# Patient Record
Sex: Female | Born: 1970 | Race: White | Hispanic: No | Marital: Married
Health system: Southern US, Community
[De-identification: ages and names within clinical notes are randomized; demographics above are authoritative.]

---

## 1997-12-01 ENCOUNTER — Other Ambulatory Visit: Admission: RE | Admit: 1997-12-01 | Discharge: 1997-12-01 | Payer: Self-pay | Admitting: Obstetrics and Gynecology

## 1998-05-27 ENCOUNTER — Other Ambulatory Visit: Admission: RE | Admit: 1998-05-27 | Discharge: 1998-05-27 | Payer: Self-pay | Admitting: Obstetrics and Gynecology

## 1999-02-22 ENCOUNTER — Other Ambulatory Visit: Admission: RE | Admit: 1999-02-22 | Discharge: 1999-02-22 | Payer: Self-pay | Admitting: Obstetrics and Gynecology

## 2001-04-02 ENCOUNTER — Encounter: Admission: RE | Admit: 2001-04-02 | Discharge: 2001-04-02 | Payer: Self-pay | Admitting: Obstetrics and Gynecology

## 2001-04-02 ENCOUNTER — Encounter: Payer: Self-pay | Admitting: Obstetrics and Gynecology

## 2003-05-26 ENCOUNTER — Other Ambulatory Visit: Admission: RE | Admit: 2003-05-26 | Discharge: 2003-05-26 | Payer: Self-pay | Admitting: Obstetrics and Gynecology

## 2003-05-29 ENCOUNTER — Emergency Department (HOSPITAL_COMMUNITY): Admission: EM | Admit: 2003-05-29 | Discharge: 2003-05-29 | Payer: Self-pay | Admitting: Emergency Medicine

## 2004-09-15 ENCOUNTER — Other Ambulatory Visit: Admission: RE | Admit: 2004-09-15 | Discharge: 2004-09-15 | Payer: Self-pay | Admitting: Obstetrics and Gynecology

## 2005-08-07 ENCOUNTER — Inpatient Hospital Stay (HOSPITAL_COMMUNITY): Admission: AD | Admit: 2005-08-07 | Discharge: 2005-08-08 | Payer: Self-pay | Admitting: Obstetrics and Gynecology

## 2005-12-09 ENCOUNTER — Inpatient Hospital Stay (HOSPITAL_COMMUNITY): Admission: AD | Admit: 2005-12-09 | Discharge: 2005-12-12 | Payer: Self-pay | Admitting: Obstetrics and Gynecology

## 2006-01-29 ENCOUNTER — Other Ambulatory Visit: Admission: RE | Admit: 2006-01-29 | Discharge: 2006-01-29 | Payer: Self-pay | Admitting: Obstetrics and Gynecology

## 2009-07-07 ENCOUNTER — Inpatient Hospital Stay (HOSPITAL_COMMUNITY)
Admission: AD | Admit: 2009-07-07 | Discharge: 2009-07-07 | Payer: Self-pay | Source: Home / Self Care | Admitting: Obstetrics and Gynecology

## 2009-08-09 ENCOUNTER — Inpatient Hospital Stay (HOSPITAL_COMMUNITY): Admission: AD | Admit: 2009-08-09 | Discharge: 2009-08-12 | Payer: Self-pay | Admitting: Obstetrics and Gynecology

## 2009-08-09 ENCOUNTER — Inpatient Hospital Stay (HOSPITAL_COMMUNITY): Admission: AD | Admit: 2009-08-09 | Discharge: 2009-08-09 | Payer: Self-pay | Admitting: Obstetrics and Gynecology

## 2010-06-10 ENCOUNTER — Encounter: Payer: Self-pay | Admitting: Family Medicine

## 2010-08-09 LAB — COMPREHENSIVE METABOLIC PANEL
ALT: 42 U/L — ABNORMAL HIGH (ref 0–35)
AST: 52 U/L — ABNORMAL HIGH (ref 0–37)
Albumin: 2.6 g/dL — ABNORMAL LOW (ref 3.5–5.2)
Alkaline Phosphatase: 121 U/L — ABNORMAL HIGH (ref 39–117)
CO2: 23 mEq/L (ref 19–32)
Chloride: 103 mEq/L (ref 96–112)
Creatinine, Ser: 0.57 mg/dL (ref 0.4–1.2)
GFR calc Af Amer: >60 mL/min (ref >60)
GFR calc non Af Amer: >60 mL/min (ref >60)
Potassium: 3.9 mEq/L (ref 3.5–5.1)
Total Bilirubin: 0.9 mg/dL (ref 0.3–1.2)

## 2010-08-09 LAB — URINE MICROSCOPIC-ADD ON: RBC / HPF: NONE SEEN RBC/hpf (ref <3)

## 2010-08-09 LAB — URINALYSIS, ROUTINE W REFLEX MICROSCOPIC
Glucose, UA: 250 mg/dL — AB
Hgb urine dipstick: NEGATIVE
Ketones, ur: NEGATIVE mg/dL
Nitrite: NEGATIVE
Protein, ur: NEGATIVE mg/dL
Specific Gravity, Urine: 1.025 (ref 1.005–1.030)
Urobilinogen, UA: 1 mg/dL (ref 0.0–1.0)
pH: 5.5 (ref 5.0–8.0)

## 2010-08-09 LAB — CBC
MCV: 82.7 fL (ref 78.0–100.0)
Platelets: 300 10*3/uL (ref 150–400)
RBC: 3.62 MIL/uL — ABNORMAL LOW (ref 3.87–5.11)
WBC: 12.2 10*3/uL — ABNORMAL HIGH (ref 4.0–10.5)

## 2010-08-09 LAB — AMYLASE: Amylase: 72 U/L (ref 0–105)

## 2010-08-13 LAB — CBC
HCT: 32.8 % — ABNORMAL LOW (ref 36.0–46.0)
Hemoglobin: 10.6 g/dL — ABNORMAL LOW (ref 12.0–15.0)
MCHC: 32.4 g/dL (ref 30.0–36.0)
MCHC: 33 g/dL (ref 30.0–36.0)
MCV: 80.9 fL (ref 78.0–100.0)
MCV: 81.4 fL (ref 78.0–100.0)
Platelets: 213 10*3/uL (ref 150–400)
Platelets: 285 10*3/uL (ref 150–400)
RBC: 3.19 MIL/uL — ABNORMAL LOW (ref 3.87–5.11)
RDW: 16.5 % — ABNORMAL HIGH (ref 11.5–15.5)
RDW: 16.7 % — ABNORMAL HIGH (ref 11.5–15.5)

## 2010-08-13 LAB — HEMOGLOBIN AND HEMATOCRIT, BLOOD
HCT: 26.3 % — ABNORMAL LOW (ref 36.0–46.0)
Hemoglobin: 8.8 g/dL — ABNORMAL LOW (ref 12.0–15.0)

## 2010-10-06 NOTE — H&P (Signed)
NAMESHAWNTEL, FARNWORTH             ACCOUNT NO.:  [id number]   MEDICAL RECORD NO.:  [id number]          PATIENT TYPE:  INP   LOCATION:  9164                          FACILITY:  [facility]   PHYSICIAN:  Osborn Coho, M.D.   DATE OF BIRTH:  [birth year]   DATE OF ADMISSION:  12/09/2005  DATE OF DISCHARGE:                                HISTORY & PHYSICAL   Ms. Cavallaro is a 40 year old, gravida 2, para 0-0-1-0, at 40-1/7 weeks, EDD  December 08, 2005, who presents following spontaneous rupture of membranes at  home for clear fluid  this a.m. She complains of mild irregular  contractions, positive fetal movement.  No vaginal bleeding.  Denies any  headache, visual changes or epigastric pain.  Her pregnancy has been  followed by the C.N.M. service at [facility] and is  remarkable for  1.  Anxiety.  2.  First trimester spotting  3.  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  4.  Group B strep negative.   This patient presented for prenatal care at the office of [facility] on April 20, 2005, at approximately [redacted] weeks gestation, EDD  determined by 7-week 2-day ultrasound and confirmed with followup. Her  pregnancy has been essentially unremarkable.  She has been size equal to  dates throughout, normotensive with no proteinuria. Prenatal lab work on  April 20, 2005:  Hemoglobin and hematocrit 12.1 and 35.3, respectively,  platelets 310,000.  Blood type and Rh O+, antibody screen negative, VDRL  nonreactive, rubella immune, hepatitis B surface antigen negative, HIV  negative.  Pap smear within normal limits.  GC and chlamydia negative.  CF  testing positive, husband CF negative.  At 28 weeks, 1-hour glucose  challenge 150.  RPR nonreactive.  Three-hour GTT with 1 abnormal value.  At  36 weeks, culture of the vaginal tract is negative for group B strep. The  patient has an allergy to SULFA which causes nausea and vomiting.  She  denies the use of tobacco, alcohol or  illicit drugs.   OB HISTORY:  In 1985, the patient had a first trimester spontaneous AB and a  D&C with no complications.  This is her second and current pregnancy.   PAST MEDICAL HISTORY:  Is unremarkable.   FAMILY HISTORY:  The patient's father with a history of chronic hypertension  and paternal aunts with thrombophlebitis, paternal grandmother dementia,  maternal grandmother pancreatic cancer.   GENETIC HISTORY:  There is no history of any familial or chromosomal  disorders, any children that were born with birth defects or any that died  in infancy.   SOCIAL HISTORY:  Ms. Wiegman is a married Caucasian female. She works as an  International aid/development worker. Her husband, Baylen Dea, is involved and supportive.  He works in home improvement. They are non-denominational in their Christian  faith.   REVIEW OF SYSTEMS:  Is as described above.  The patient is typical of one  with uterine pregnancy at term with premature rupture of membranes.   PHYSICAL EXAMINATION:  VITAL SIGNS: Stable.  The patient is afebrile.  HEENT: Is unremarkable.  HEART:  Regular rate and rhythm.  LUNGS: Are clear.  ABDOMEN: Is soft and nontender.  It is gravid in its contour. Uterine fundus  is noted to extend 38 cm above the level of the pubic symphysis.  Leopold's  maneuver finds the infant to be in a longitudinal lie, cephalic  presentation, and the estimated fetal weight is 7-1/2 pounds. The baseline  of the fetal heart rate monitor is 140s with average long-term variability.  Reactivity is present with no periodic changes.  EXTREMITIES: Show no pathologic edema.  DTRs are 1+ with no clonus.   ASSESSMENT:  1.  Intrauterine pregnancy at term.  2.  Premature rupture of membranes.  3.  Early labor.   PLAN:  Admit per Dr. Osborn Coho, routine C.N.M. orders.  Anticipate  spontaneous vaginal delivery.      Rica Koyanagi, C.N.M.      Osborn Coho, M.D.  Electronically Signed    SDM/MEDQ  D:   12/09/2005  T:  12/09/2005  Job:  [id number]

## 2010-10-06 NOTE — Discharge Summary (Signed)
NAMEALIYANAH, ROZAS             ACCOUNT NO.:  [id number]   MEDICAL RECORD NO.:  [id number]          PATIENT TYPE:  INP   LOCATION:  9115                          FACILITY:  [facility]   PHYSICIAN:  Janine Limbo, M.D.DATE OF BIRTH:  [birth year]   DATE OF ADMISSION:  12/09/2005  DATE OF DISCHARGE:                                 DISCHARGE SUMMARY   ADMISSION DIAGNOSES:  1.  Intrauterine pregnancy at term.  2.  Premature rupture of membranes at term.  3.  Early labor.   DISCHARGE DIAGNOSES:  1.  Term pregnancy.  2.  Second stage bradycardia.  3.  Third degree laceration.  4.  Anemia.   PROCEDURES:  1.  Vacuum extraction, vaginal delivery.  2.  Repair partial third degree laceration.   HOSPITAL COURSE:  Ms. Melcher is a 40 year old gravida 2, para 0-0-1-0 at 54  and 1/7 weeks, who was admitted on December 09, 2005 in the morning following  spontaneous rupture of membranes at approximately 8 a.m.  Pregnancy had been  remarkable for (1) anxiety, (2) first trimester spotting, (3) positive  cystic fibrosis carrier but husband cystic fibrosis carrier negative, (4)  Group B strep negative.   On exam, the patient was having some irregular contractions.  Her cervix was  1, 50% vertex at a -2 station with clear fluid noted.  The patient requested  to rest for a time.  Contractions remained mild and irregular.  Pitocin was  begun later that early evening.  She was given some IV pain medication later  that evening.  Epidural was placed prior to midnight.  Following the  epidural, the patient did have three episodes of bradycardia which seemed to  be related to hypotension.  Fetal scalp lead and pressure monitor were  inserted after rupture of a forebag with clear fluid noted during exam.  Cervix at this time was 3, 80% vertex and -2.  Pitocin was continued through  the night.  By approximately 6:30 a.m. contractions had begun to be much  more effective.  Cervix at that time was 7 cm  completely effaced with the  vertex at a 0 station.  The patient then progressed well to completely  dilated at 8:30 in the morning with a vertex at +1 station.  Temperature was  99.3.  There was no descent with initial pushing effort and there was  minimal sensation.  Therefore, the decision was made to allow the patient to  labor down for a time.  The patient began to push at approximately 9.  Subsequent to that she began to have some deep variable decelerations.  Dr.  Stefano Gaul was consulted.  Vacuum extraction was offered to the patient since  the vertex was at a +2 station.  The patient and her husband did consent.  The vacuum was applied by Dr. Stefano Gaul without difficulty and vaginal  delivery was accomplished. There was a third degree laceration noted.  Findings were a viable female by the name of Carley Hammed, weight 7 pounds,  Apgars  were 7 and 9.  Cord pH was 7.13.  Infant was taken to the full  term nursery  and mother was taken to recovery in good condition.  By postpartum day 1 the  patient was doing well.  She was up ad lib without any difficulty or  syncope.  Hemoglobin was 8.5 down from 11.3.  White blood cell count was  13.6.  Her physical exam was within normal limits.  She was breastfeeding.  By postpartum day 2 the patient was continuing to do well.  She was  tolerating a regular diet.  She was having good control of pain with p.o.  pain medications and other comfort measures.  Breastfeeding was going well.  She was deemed to receive full benefit of her hospital stay and was  discharged home.   DISCHARGE INSTRUCTIONS:  Per [employer].   DISCHARGE MEDICATIONS:  1.  Motrin 600 mg by mouth every six hours as needed for pain.  2.  Tylox 1 to 2 by mouth every three to four hours as needed for pain.  3.  Prenatal vitamin 1 by mouth daily.   Discharge follow-up will occur in six weeks at [facility] or p.r.n.      Renaldo Reel Emilee Hero, C.N.M.       Janine Limbo, M.D.  Electronically Signed    VLL/MEDQ  D:  12/12/2005  T:  12/12/2005  Job:  [id number]

## 2010-10-06 NOTE — Op Note (Signed)
[name], [name]             ACCOUNT NO.:  [id number]   MEDICAL RECORD NO.:  [id number]          PATIENT TYPE:  INP   LOCATION:  9164                          FACILITY:  [facility]   PHYSICIAN:  Janine Limbo, M.D.DATE OF BIRTH:  Oct 18, 1970   DATE OF PROCEDURE:  12/10/2005  DATE OF DISCHARGE:                                 OPERATIVE REPORT   PREOPERATIVE DIAGNOSES:  1.  Term intrauterine pregnancy (EDC is December 08, 2005).  2.  Second stage bradycardia.   POSTOPERATIVE DIAGNOSES:  1.  Term intrauterine pregnancy (EDC is December 08, 2005).  2.  Second stage bradycardia.  3.  Partial third degree midline laceration.   PROCEDURES:  1.  Vacuum extraction vaginal delivery.  2.  Repair of partial third degree laceration.   OBSTETRICIAN:  Dr. Leonard Schwartz.   NURSE MIDWIFE:  Nigel Bridgeman.   ANESTHETIC:  Epidural, 1% Xylocaine.   DISPOSITION:  Carrie Castaneda is a 40 year old female, gravida 2, para 0-0-1-0,  who presented on December 09, 2005.  She had rupture of membranes at  approximately 7 o'clock a.m. on December 09, 2005.  The patient has been  followed at the [facility]  [facility] for [facility].  This pregnancy has been largely  uncomplicated.  The patient labored slowly.  Pitocin augmentation was begun.  The patient was started on penicillin because she did not deliver within 18  hours of ruptured membranes.  Labor was thought to began at approximately 9  o'clock p.m. on December 09, 2005.  The patient was completely dilated at 8:19  a.m. on  December 10, 2005.  The patient pushed effectively.  The infant began  having decelerations that occurred after the peak of the contraction.  The  patient was able to push the fetal head to a +3 station.  After it became  apparent that the decelerations would be recurrent she was given oxygen.  The decelerations continued.  At this point, we discussed our options for  management,  which included  continued pushing, operative vaginal delivery,  and cesarean delivery.  The risk and benefits of those options were  discussed.  The patient and her husband elected to proceed with operative  vaginal delivery.  We discussed forceps vaginal delivery and vacuum  extraction vaginal delivery.  The patient and her husband wanted to proceed  with vacuum extraction vaginal delivery.  The specific risks associated with  vacuum extraction vaginal delivery were reviewed including caput formation,  hematoma formation, the rare risk of intracranial bleeding, and the risk  that the vacuum extraction would be unsuccessful and that we would still  need to proceed with cesarean delivery.  The patient and her husband  understand all the above risks and were ready to proceed.   FINDINGS:  The weight of the infant is currently not known.  A female infant  was delivered Carley Hammed).  The Apgar scores were 7 at 1 minute and 9 at 5  minutes.  There was a three-vessel cord present.  Placenta was normal.  The  perineum was inspected and the patient was  noted to have a partial third  degree laceration.  The arterial cord blood pH was 7.13.   PROCEDURE:  The patient was placed in a lithotomy position in the delivery  suite.  The perineum was prepped for delivery.  A sterile drape was used.  The Foley catheter that was in the bladder was removed.  The intrauterine  pressure catheter and the fetal scalp electrode were removed.  The cervix  was noted to be completely dilated and 100% effaced.  The infant was thought  to be of medium size.  The fetal head was in an occiput anterior position.  The Kiwi vacuum extractor was applied.  The patient was allowed to push.  One pop off occurred.  The Kiwi was reapplied and the patient was then  able to deliver the fetal head.  The mouth and nose were suctioned.  The  remainder of the infant was then delivered.  The cord was clamped and cut  and the infant was handed to Dr. Rennis Golden of the pediatric team.  Routine cord blood studies were obtained.  The perineum was inspected.  A  partial third degree laceration was noted.  3-0 Vicryl was placed in the  capsule of the rectum and the edges were reapproximated.  We then used a  running 3-0 Vicryl suture in a standard fashion to close the remainder of  the laceration.  The placenta was then removed.  The patient's fundus was  massaged and hemostasis was noted to be adequate.  The patient was returned  to the supine position.  The baby was allowed to remain in the room with the  mother and father for bonding.      Janine Limbo, M.D.  Electronically Signed     AVS/MEDQ  D:  12/10/2005  T:  12/10/2005  Job:  [id number]

## 2011-07-22 IMAGING — US US ABDOMEN COMPLETE
1 series · 13 of 25 positions shown · non-contrast
Comparison: None available.

CLINICAL DATA: Right upper quadrant pain.  35 weeks pregnant.

COMPLETE ABDOMINAL ULTRASOUND

[Series 1: us abdomen complete · 0.29mm/px · 13 of 72 slices shown]
[im 1/72]
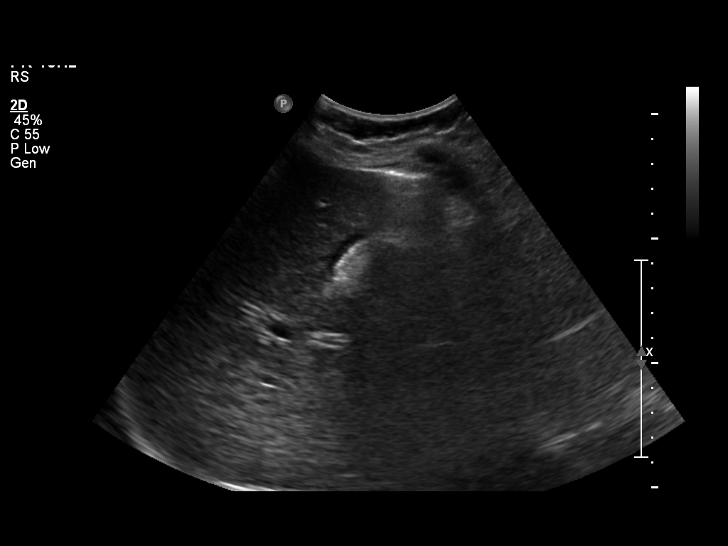
[im 6/72]
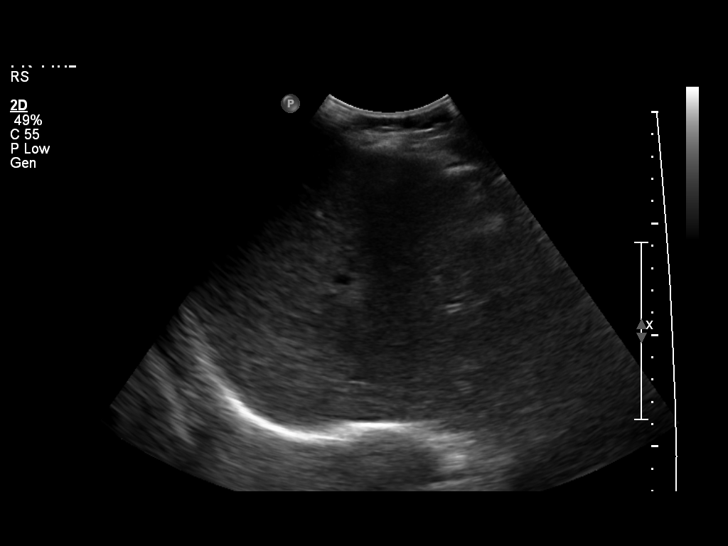
[im 12/72]
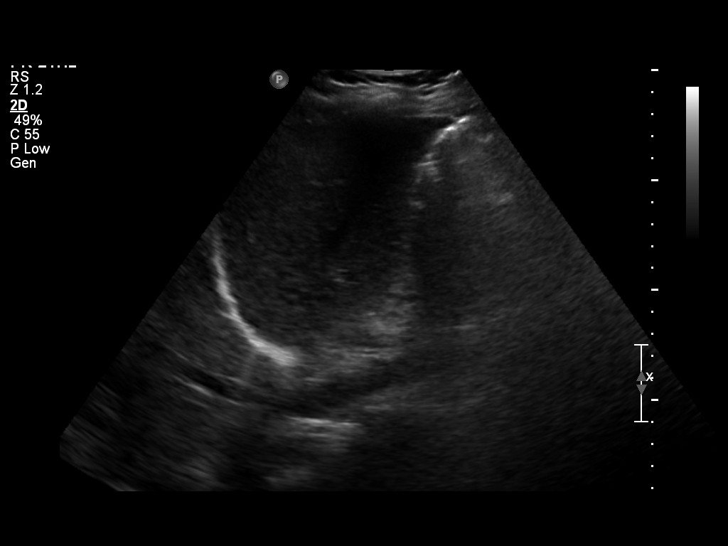
[im 18/72]
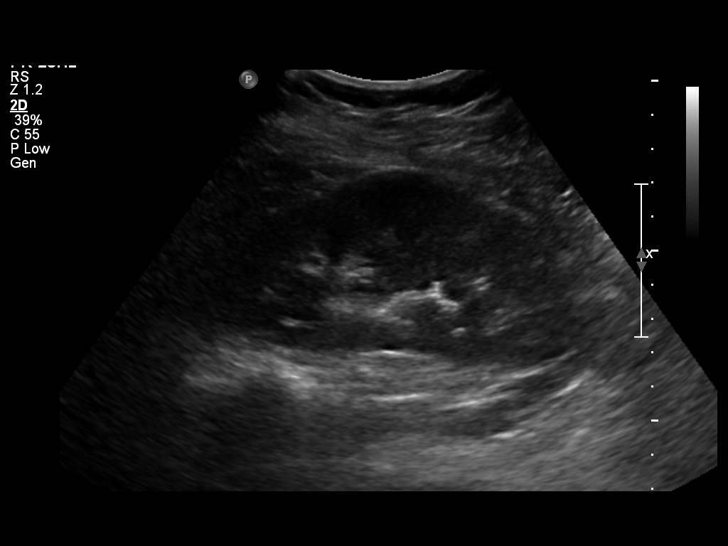
[im 24/72]
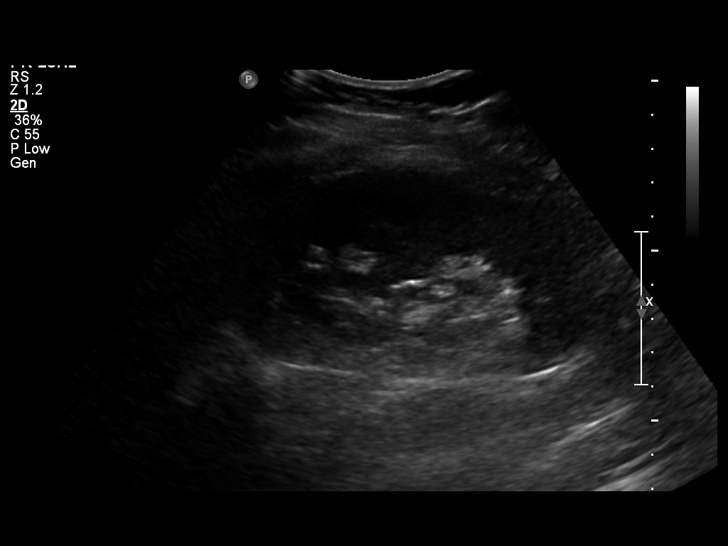
[im 30/72]
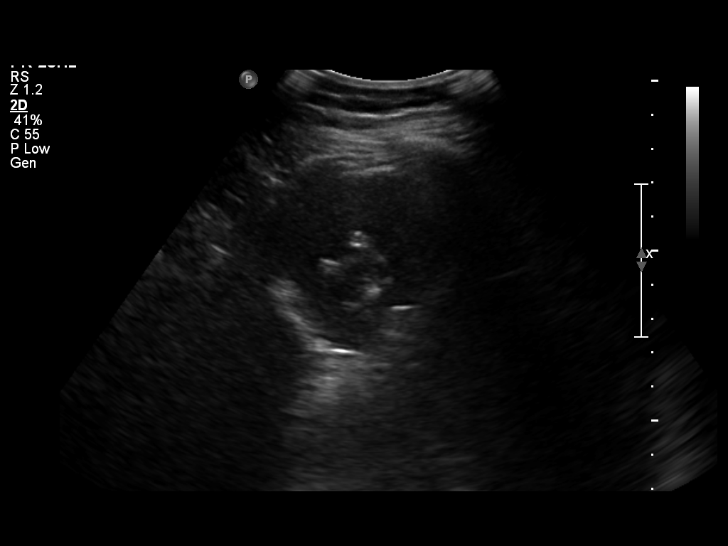
[im 36/72]
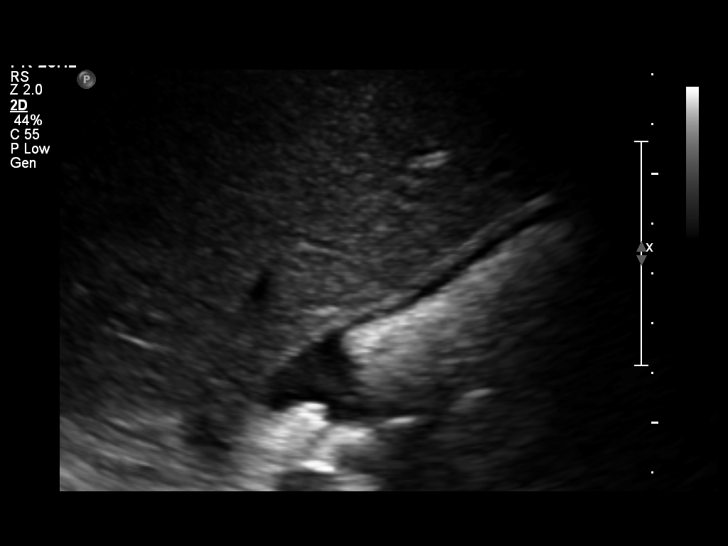
[im 42/72]
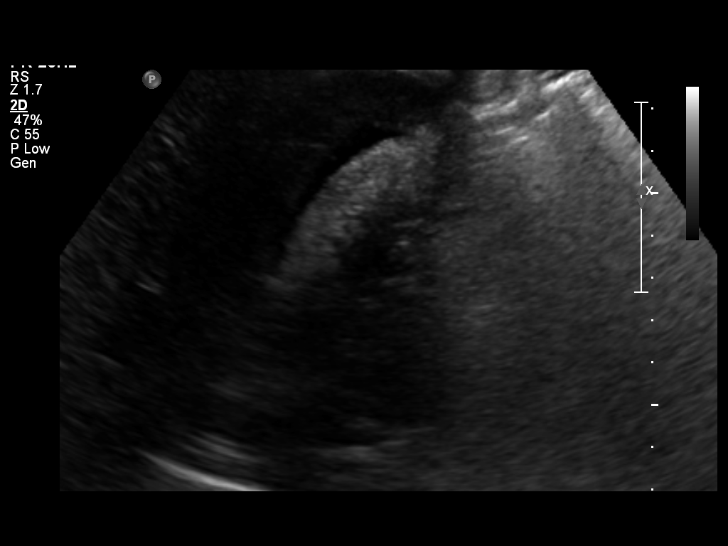
[im 48/72]
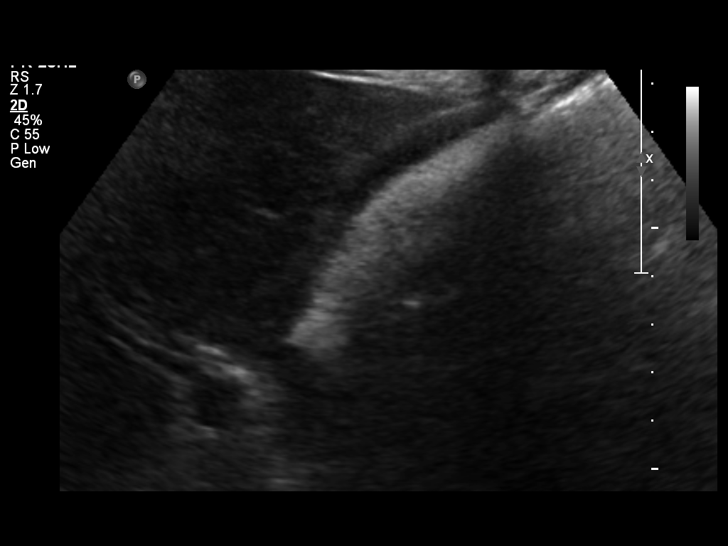
[im 54/72]
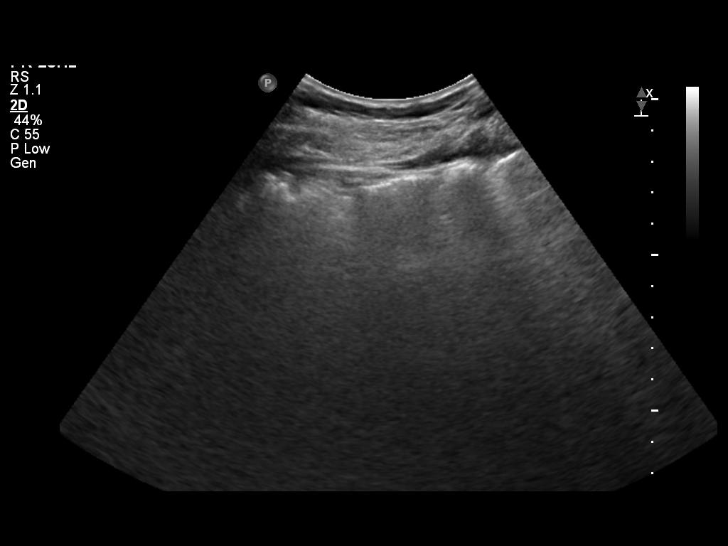
[im 60/72]
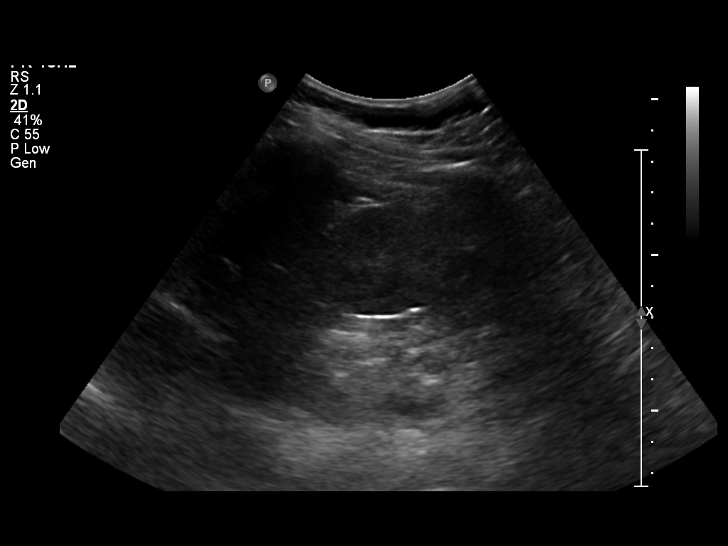
[im 66/72]
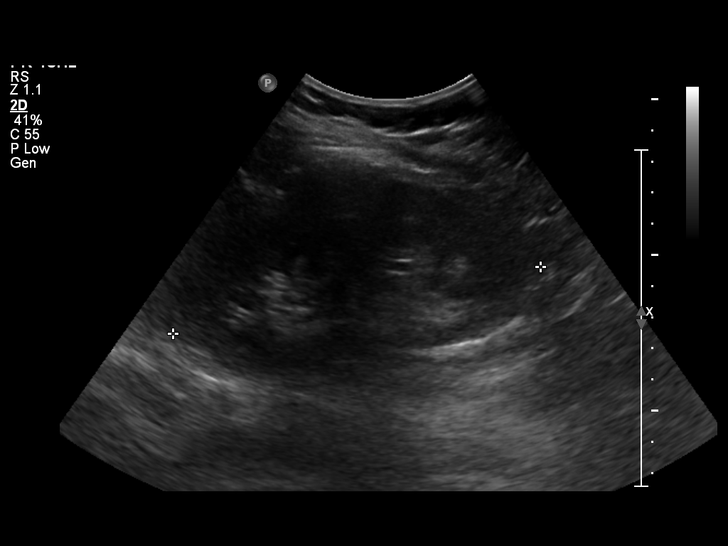
[im 72/72]
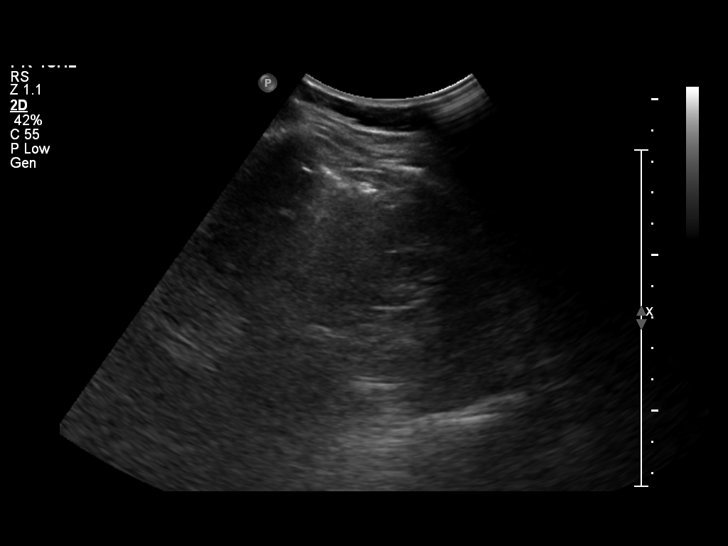

[13 of 25 positions shown; findings below may reference images not displayed]

FINDINGS: Gallbladder:  The gallbladder is nearly filled with echogenic
material consistent with sludge.  This is not mobile on
repositioning.  Small echogenic foci are present within the
gallbladder consistent with gallstones surrounded by tumefactive
sludge.  Posterior acoustic shadowing is present.  There is no wall
thickening, sonographic Murphy's sign or pericholecystic fluid.

Common bile duct:  4 mm, normal

Liver:  No focal lesion identified.  Within normal limits in
parenchymal echogenicity.

IVC:  Appears normal.

Pancreas:  Obscured by overlying bowel gas.

Spleen:  5.5 cm of normal echotexture.

Right Kidney:  11.1 cm. Normal echotexture.  Normal central sinus
echo complex.  No calculi or hydronephrosis.

Left Kidney:  12.0 cm. Normal echotexture.  Normal central sinus
echo complex.  No calculi or hydronephrosis.

Abdominal aorta:  Proximal abdominal aorta normal.
IMPRESSION: Large amount of tumefactive sludge within the gallbladder with
gallstones contained within the sludge.  No sonographic Murphy's
sign or ultrasound findings of acute cholecystitis.

## 2016-03-13 ENCOUNTER — Encounter (HOSPITAL_BASED_OUTPATIENT_CLINIC_OR_DEPARTMENT_OTHER): Payer: Self-pay

## 2016-03-13 ENCOUNTER — Emergency Department (HOSPITAL_BASED_OUTPATIENT_CLINIC_OR_DEPARTMENT_OTHER)
Admission: EM | Admit: 2016-03-13 | Discharge: 2016-03-13 | Disposition: A | Discharge status: Home / Self Care | Payer: Managed Care, Other (non HMO) | Attending: Emergency Medicine | Admitting: Emergency Medicine

## 2016-03-13 DIAGNOSIS — J069 Acute upper respiratory infection, unspecified: Secondary | ICD-10-CM | POA: Diagnosis not present

## 2016-03-13 MED ORDER — LIDOCAINE VISCOUS 2 % MT SOLN: 15.0000 mL | Freq: Once | OROMUCOSAL | Status: DC

## 2016-03-13 MED ORDER — MAGIC MOUTHWASH: 10.0000 mL | Freq: Three times a day (TID) | ORAL | 0 refills | Status: AC | PRN

## 2016-03-13 MED ORDER — IBUPROFEN 800 MG PO TABS: 800.0000 mg | ORAL_TABLET | Freq: Once | ORAL | Status: DC

## 2016-03-13 MED ORDER — BENZONATATE 100 MG PO CAPS: 100.0000 mg | ORAL_CAPSULE | Freq: Three times a day (TID) | ORAL | 0 refills | Status: AC

## 2016-03-13 MED ORDER — IBUPROFEN 800 MG PO TABS: 800.0000 mg | ORAL_TABLET | Freq: Three times a day (TID) | ORAL | 0 refills | Status: AC

## 2016-03-13 MED ORDER — BENZONATATE 100 MG PO CAPS: 200.0000 mg | ORAL_CAPSULE | Freq: Once | ORAL | Status: DC

## 2016-03-13 NOTE — ED Triage Notes (Signed)
Pt c/o cough and sore throat since Saturday.  She has been trying cough drops and muccinex without relief and tonight she woke with a bad coughing fit and she could not catch her breath.  No fevers, lungs clear, non productive cough.

## 2016-03-13 NOTE — ED Provider Notes (Addendum)
[facility] Provider Note   CSN: [id number] Arrival date & time: 03/13/16  0123     History   Chief Complaint Chief Complaint  Patient presents with  . URI    HPI Carrie Castaneda is a 45 y.o. female.  The history is provided by the patient.  URI   This is a new problem. The current episode started 12 to 24 hours ago. The problem has been gradually worsening. There has been no fever. Associated symptoms include congestion, plugged ear sensation, sore throat and cough. Pertinent negatives include no chest pain, no abdominal pain, no diarrhea, no vomiting, no headaches, no swollen glands, no joint pain, no neck pain and no wheezing. Treatments tried: mucinex. The treatment provided no relief.  Cough  This is a new problem. The current episode started 12 to 24 hours ago. The problem occurs every few minutes. The problem has not changed since onset.The cough is non-productive. There has been no fever. Associated symptoms include ear congestion and sore throat. Pertinent negatives include no chest pain, no headaches, no shortness of breath and no wheezing. She has tried decongestants for the symptoms. The treatment provided no relief. She is not a smoker.    History reviewed. No pertinent past medical history.  There are no active problems to display for this patient.   History reviewed. No pertinent surgical history.  OB History    No data available       Home Medications    Prior to Admission medications   Not on File    Family History No family history on file.  Social History Social History  Substance Use Topics  . Smoking status: Not on file  . Smokeless tobacco: Not on file  . Alcohol use Not on file     Allergies   Sulfa antibiotics   Review of Systems Review of Systems  HENT: Positive for congestion and sore throat. Negative for drooling, facial swelling, tinnitus, trouble swallowing and voice change.   Eyes: Negative for photophobia.    Respiratory: Positive for cough. Negative for shortness of breath and wheezing.   Cardiovascular: Negative for chest pain.  Gastrointestinal: Negative for abdominal pain, diarrhea and vomiting.  Musculoskeletal: Negative for joint pain and neck pain.  Neurological: Negative for headaches.  All other systems reviewed and are negative.    Physical Exam Updated Vital Signs BP 118/73 (BP Location: Right Arm)   Pulse 81   Temp 98.2 F (36.8 C) (Oral)   Resp 18   Ht 5\' 3"  (1.6 m)   Wt 140 lb (63.5 kg)   LMP 03/11/2016 (Exact Date)   SpO2 100%   BMI 24.80 kg/m   Physical Exam  Constitutional: She is oriented to person, place, and time. She appears well-developed and well-nourished. No distress.  HENT:  Head: Normocephalic and atraumatic.  Right Ear: External ear normal.  Left Ear: External ear normal.  Mouth/Throat: Oropharynx is clear and moist. No oropharyngeal exudate.  Intact phonation mallempati class 1 no pain with displacement of the trachea  Eyes: Conjunctivae are normal. Pupils are equal, round, and reactive to light.  Neck: Normal range of motion. Neck supple. No tracheal deviation present.  Cardiovascular: Normal rate, regular rhythm, normal heart sounds and intact distal pulses.   Pulmonary/Chest: Effort normal and breath sounds normal. No stridor. No respiratory distress. She has no wheezes. She has no rales.  Abdominal: Soft. Bowel sounds are normal. She exhibits no mass. There is no tenderness. There is no rebound  and no guarding.  Musculoskeletal: Normal range of motion.  Lymphadenopathy:    She has no cervical adenopathy.  Neurological: She is alert and oriented to person, place, and time. She has normal reflexes.  Skin: Skin is warm and dry. Capillary refill takes less than 2 seconds.  Psychiatric: She has a normal mood and affect.     ED Treatments / Results    Procedures Procedures (including critical care time)  Medications Ordered in ED Medications   lidocaine (XYLOCAINE) 2 % viscous mouth solution 15 mL (not administered)  ibuprofen (ADVIL,MOTRIN) tablet 800 mg (not administered)  benzonatate (TESSALON) capsule 200 mg (not administered)     Final Clinical Impressions(s) / ED Diagnoses  Viral syndrome:  Based on CENTOR criteria there is no indication for further testing or treatment.  Intact phonation no pain with displacement of the trachea.  Strict return precautions given. Return for fever, inability to swallow shortness of breath chest pain change in voice or any concerns. Explained at length that this is a viral infection and patient may feel under the weather for several days.  I explained at length to the patient that her lungs are completely clear her throat is open. EDP expressed empathy and apologized to the patient that the patient is feeling under the weather and explained that we have symptomatic relief for her symptoms but that there is not a immediate cure. In addition to medication she should drink copious liquids. Will treat symptomatically. All questions answered to patient's parents satisfaction. Based on history and exam patient has been appropriately medically screened and emergency conditions excluded. Patient is stable for discharge at this time. Follow up with your PMD for recheck in 2 days and strict return precautions given.  Must return for fevers, spread or any nose or eye involvement    Esgar Barnick, MD 03/13/16 [phone]    Cy BlamerApril Coralynn Gaona, MD 03/13/16 [phone]

## 2016-03-13 NOTE — ED Notes (Signed)
Pt verbalizes understanding of d/c instructions and denies any further needs at this time.
